# Patient Record
Sex: Female | Born: 1960 | Race: White | Hispanic: No | Marital: Single | Smoking: Former smoker
Health system: Southern US, Community
[De-identification: ages and names within clinical notes are randomized; demographics above are authoritative.]

## PROBLEM LIST (undated history)

## (undated) DIAGNOSIS — E039 Hypothyroidism, unspecified: Secondary | ICD-10-CM

## (undated) DIAGNOSIS — K589 Irritable bowel syndrome without diarrhea: Secondary | ICD-10-CM

## (undated) DIAGNOSIS — E785 Hyperlipidemia, unspecified: Secondary | ICD-10-CM

## (undated) DIAGNOSIS — I10 Essential (primary) hypertension: Secondary | ICD-10-CM

## (undated) HISTORY — PX: TUBAL LIGATION: SHX77

## (undated) HISTORY — PX: OVARIAN CYST REMOVAL: SHX89

## (undated) HISTORY — DX: Hyperlipidemia, unspecified: E78.5

## (undated) HISTORY — DX: Hypothyroidism, unspecified: E03.9

## (undated) HISTORY — DX: Irritable bowel syndrome, unspecified: K58.9

## (undated) HISTORY — PX: OTHER SURGICAL HISTORY: SHX169

## (undated) HISTORY — PX: CHOLECYSTECTOMY: SHX55

## (undated) HISTORY — DX: Essential (primary) hypertension: I10

---

## 2001-07-15 ENCOUNTER — Emergency Department (HOSPITAL_COMMUNITY): Admission: EM | Admit: 2001-07-15 | Discharge: 2001-07-15 | Payer: Self-pay | Admitting: Emergency Medicine

## 2001-12-11 ENCOUNTER — Encounter (INDEPENDENT_AMBULATORY_CARE_PROVIDER_SITE_OTHER): Payer: Self-pay | Admitting: Specialist

## 2001-12-11 ENCOUNTER — Ambulatory Visit (HOSPITAL_BASED_OUTPATIENT_CLINIC_OR_DEPARTMENT_OTHER): Admission: RE | Admit: 2001-12-11 | Discharge: 2001-12-11 | Payer: Self-pay | Admitting: Plastic Surgery

## 2003-12-19 ENCOUNTER — Other Ambulatory Visit: Admission: RE | Admit: 2003-12-19 | Discharge: 2003-12-19 | Payer: Self-pay | Admitting: Family Medicine

## 2004-04-24 ENCOUNTER — Ambulatory Visit: Payer: Self-pay | Admitting: Gastroenterology

## 2004-04-30 ENCOUNTER — Ambulatory Visit (HOSPITAL_COMMUNITY): Admission: RE | Admit: 2004-04-30 | Discharge: 2004-04-30 | Payer: Self-pay | Admitting: Gastroenterology

## 2005-04-15 ENCOUNTER — Other Ambulatory Visit: Admission: RE | Admit: 2005-04-15 | Discharge: 2005-04-15 | Payer: Self-pay | Admitting: Family Medicine

## 2006-03-02 IMAGING — NM NM HEPATO W/GB/PHARM/[PERSON_NAME]
1 series · 6 of 6 positions shown · non-contrast
Comparison: none

CLINICAL DATA: Abdominal pain.  
 HEPATOBILIARY SCAN WITH EJECTION FRACTION:  
 The study was performed after injection of 8.2 mCi of KKmNc Choletec.  There is prompt visualization of the liver, biliary ductal system and gallbladder.  When the gallbladder is optimally filled at 60 minutes, the patient was given 1.6 micrograms of cholecystokinin IV.  At 30 minutes, 54.7% has been ejected and at 60 minutes 55.3% has been ejected.

[hepatobiliary · 1.20mm/px · 6 of 10 frames shown]
[frame 1/10]
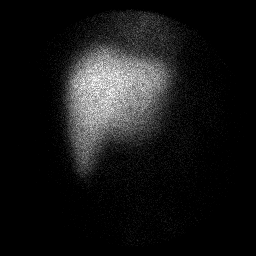
[frame 3/10]
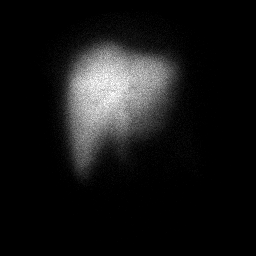
[frame 5/10]
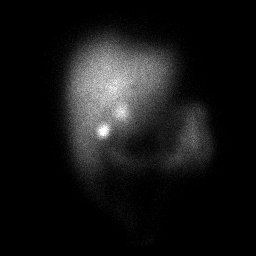
[frame 6/10]
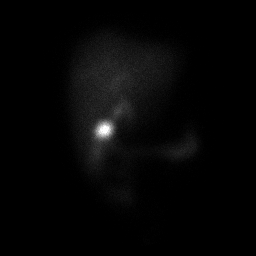
[frame 8/10]
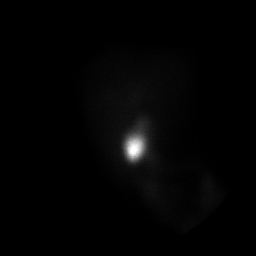
[frame 10/10]
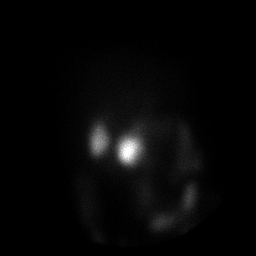

[6 of 6 positions shown; findings below may reference images not displayed]

IMPRESSION: 1.  There is prompt filling of the gallbladder. 
 2.  Ejection fraction is normal being 55% at 1 hour.  Normal is over 30%.

## 2008-03-06 ENCOUNTER — Ambulatory Visit: Payer: Self-pay | Admitting: Cardiology

## 2008-03-13 ENCOUNTER — Ambulatory Visit: Payer: Self-pay | Admitting: Cardiology

## 2008-07-31 ENCOUNTER — Ambulatory Visit: Payer: Self-pay | Admitting: Cardiology

## 2010-11-03 NOTE — Assessment & Plan Note (Signed)
[facility]                            CARDIOLOGY OFFICE NOTE   Christine Tran, Christine Tran                       MRN:          [id number]  DATE:03/06/2008                            DOB:          Mar 03, 1961    PRIMARY CARE PHYSICIAN:  Lindaann Pascal, PA   REASON FOR PRESENTATION:  Evaluate the patient with chest pain.   HISTORY OF PRESENT ILLNESS:  The patient is a pleasant 50 year old white  female without prior cardiac history.  She started riding bikes in March  for exercise.  These were long rides.  About 2 weeks ago, she was riding  up a hill.  It was one of the more exerting thing she had done.  She  developed some substernal chest tightness.  This radiated through to her  back.  It was severe.  She felt a metallic taste in her mouth.  She had  some lightheadedness and some nausea, but no vomiting.  She stated this  did not fell like a reflux.  She had to stop and it went away after 3-4  minutes.  She has been able be riding on level ground without getting  any of these symptoms.  She thinks her breathing has been fine.  She has  had no resting shortness of breath, PND, or orthopnea.  She had no  palpitations, presyncope, or syncope.  She has had a good exercise  tolerance.   PAST MEDICAL HISTORY:  Prehypertension, diet-controlled dyslipidemia,  hypothyroidism, and endometriosis.   PAST SURGICAL HISTORY:  Ovarian cyst, resected laparoscopically.   ALLERGIES INTOLERANCES:  MACRODANTIN.   MEDICATIONS:  1. BuSpar 10 mg b.i.d.  2. Birth control pills.  3. Lexapro 10 mg daily.  4. Lipitor 10 mg daily.  5. Lisinopril 10/12.5 daily.  6. Synthroid 75 mcg daily.   SOCIAL HISTORY:  The patient is not married.  She has no children.  She  works in Equities trader.  She quit smoking about 4 years ago after 1  pack per day for 20 years.  She drinks alcohol occasionally.   FAMILY HISTORY:  Noncontributory for early coronary artery disease,  though both of  her parents were alive in their 81s with hypertension.  She has a brother with diabetes.   REVIEW OF SYSTEMS:  As stated in the HPI.  Positive for occasional  migraines, irritable bowels, and reflux.  Negative for all other  systems.   PHYSICAL EXAMINATION:  GENERAL:  The patient is pleasant and in no  distress.  VITAL SIGNS:  Blood pressure 138/88, heart rate 90 and regular, weight  205 pounds, and body mass index 34.  HEENT:  Eyelids unremarkable; pupils equal, round, and reactive to  light; fundi not visualized; oral mucosa unremarkable.  NECK:  No jugular venous distension at 45 degrees; carotid upstroke  brisk and symmetric; no bruits, no thyromegaly.  LYMPHATICS:  No cervical, axillary or inguinal adenopathy.  LUNGS:  Clear to auscultation bilaterally.  BACK:  No costovertebral angle tenderness.  CHEST:  Unremarkable.  HEART:  PMI not displaced or sustained; S1 and S2 within normal limits;  no S3, no S4; no clicks, no rubs, no murmurs.  ABDOMEN:  Mildly overweight; positive bowel sounds, normal in frequency  and pitch; no bruits, no rebound, no guarding;  no midline pulsatile  mass; no hepatomegaly, no splenomegaly.  SKIN:  No rashes, no nodules.  EXTREMITIES:  2+ pulses throughout; no edema, no cyanosis, no clubbing.  NEURO:  Oriented to person, place and time; cranial nerves II through  XII grossly intact; motor grossly intact.   EKG sinus rhythm, rate 90, axis within normal limits, intervals within  normal limits, and no acute ST-T wave changes.   ASSESSMENT AND PLAN:  1. Chest discomfort.  The patient's chest discomfort has atypical      features.  However, it was exertional.  She does have      cardiovascular risk factors.  I think, the pretest probability of      obstructive coronary artery disease as the etiology is somewhat      low.  I think screening with maximum exercise stress test would be      beneficial prior to her doing a long bike ride, which she has       planned.  Therefore, I will bring her back for plane old exercise      treadmill (POET).  If she does well with this, then I would see no      need for further cardiovascular evaluation prior to this bike ride.  2. Hypertension.  Blood pressure is borderline.  We will judge how the      blood pressure does with exercise.  She has had this followed by      Lindaann Pascal.  3. Dyslipidemia.  Again, she is having this followed closely.  She has      been somewhat intolerant to STATINS.  She is having this managed      through diet.  4. Weight.  She has got a body mass index that puts her in the obese      range, but she is working on this, and I applaud that.  5. Followup will be at the time of her plane on exercise treadmill      (POET).     Rollene Rotunda, MD, [facility]  Electronically Signed    JH/MedQ  DD: 03/06/2008  DT: 03/07/2008  Job #: [id number]   cc:   Lindaann Pascal, PA

## 2010-11-03 NOTE — Procedures (Signed)
[facility]                              EXERCISE TREADMILL   Christine Tran, Christine Tran                       MRN:          [id number]  DATE:03/13/2008                            DOB:          15-Dec-1960    PRIMARY CARE PHYSICIAN:  Ernestina Penna, MD   PROCEDURE:  Exercise treadmill test.   INDICATIONS:  Evaluate the patient with chest discomfort.   PROCEDURE NOTE:  The patient is exercised using Standard Bruce protocol.  She was able to exercise for 9 minutes, which completed stage III.  She  achieved 10.4 METS.  She had a peak heart rate of 187, which was 106% of  predicted.  She had a blood pressure peak of 174/72, which was an  appropriate response.  She did not have any arrhythmias.  She did get to  her target heart rate very quickly in stage I.  She had a normal heart  rate recovery however.  The test was terminated because she achieved  target heart rate and because she was fatigued.  She did have some chest  discomfort and the metallic taste in her mouth and her peak exercise  level.  Again there were no ischemic ST wave changes.   CONCLUSION:  Negative adequate exercise treadmill test.   PLAN:  Based on the above, I told the patient she should not ride 47  miles in her next bike ride, which was her plan.  She has never got more  than 40.  I do not see any high risk features or evidence of obstructive  coronary disease.  However, I think her exercise tolerance needs to be  improved.  I suspect more deconditioning.  She is going to ride about 20  miles this week and which she has been doing routinely.  It is a level  course.  She is going to slowly build on this.  If she gets worse rather  than better, I would probably plan a cardiopulmonary stress test.  I am  going to see her back in a couple of months to see how she is doing  symptomatically.     Rollene Rotunda, MD, [facility]  Electronically Signed    JH/MedQ  DD: 03/13/2008  DT:  03/14/2008  Job #: [id number]   cc:   Lindaann Pascal, PA

## 2010-11-03 NOTE — Assessment & Plan Note (Signed)
[facility]                            CARDIOLOGY OFFICE NOTE   Christine Tran, Christine Tran                       MRN:          [id number]  DATE:07/31/2008                            DOB:          1961-04-20    PRIMARY CARE PHYSICIAN:  Lorin Picket Long, PA   REASON FOR PRESENTATION:  Evaluate the patient with shortness of breath  and chest pain.   HISTORY OF PRESENT ILLNESS:  The patient is 50 years old.  At the last  meeting, I did a stress test on her as she was complaining of the above.  She had an excellent response and a negative adequate result.  She had a  good exercise tolerance and no evidence of ischemia.  She was,  therefore, released to ride in a long bicycle trip.  However, I asked  her to limit herself to 20 miles at a time.  She did this with the most  part.  She actually went a little bit over this.  She did fine.  She had  no chest pain or shortness of breath.  She has had no complications  doing that ride and it was a quite exerting event.  She, however,  stopped exercising somewhat after that unfortunately.  She states now  she gets back on a bicycle and she is having the same symptoms she was  having when she started recently.  She is having dyspnea and more  breathlessness than she thinks she should.  She thinks that her  conditioning went down very quickly.  She is having some chest soreness  when she has to breathe hard.  This was similar to which she had prior  to her treadmill.  She is not having any resting symptoms.  She does not  have any palpitations, presyncope, or syncope.  She has not had any PND  or orthopnea.  She has had some weight gain.  She has had some swelling  in her forearms.   PAST MEDICAL HISTORY:  1. Prehypertension, diet controlled.  2. Dyslipidemia.  3. Hypothyroidism.  4. Endometriosis.  5. Ovarian cyst resected laparoscopically.   ALLERGIES/INTOLERANCES:  MACRODANTIN.   MEDICATIONS:  1. Synthroid 75 mcg  daily.  2. Pravastatin 40 mg daily.  3. Depo shot.  4. Celexa 20 mg daily.  5. Kapidex.   REVIEW OF SYSTEMS:  As stated in the HPI and otherwise negative for  other systems.   PHYSICAL EXAMINATION:  GENERAL:  The patient is in no distress.  VITAL SIGNS:  Blood pressure 136/88 and heart rate 94 and regular.  HEENT:  Eyelids unremarkable; pupils are equal, round, and reactive to  light; fundi not visualized; oral mucosa unremarkable.  NECK:  No jugular venous distention at 45 degrees, carotid upstroke  brisk and symmetric, no bruits, no thyromegaly.  LYMPHATICS:  No adenopathy.  LUNGS:  Clear to auscultation bilaterally.  BACK:  No costovertebral angle tenderness.  CHEST:  Unremarkable.  HEART:  PMI not displaced or sustained; S1 and S2 within normal limits,  no S3, no S4; no clicks, no rubs, no murmurs.  ABDOMEN:  Obese; positive bowel sounds, normal in frequency and pitch;  no bruits, no rebound, no guarding, no midline pulsatile mass; no  hepatomegaly, no splenomegaly.  SKIN:  No rashes, no nodules.  EXTREMITIES:  Pulses 2+, trace edema, no cyanosis, no clubbing.  NEURO:  Oriented to person, place, and time; cranial nerves II through  XII grossly intact; motor grossly intact.   EKG, sinus rhythm, rate 95, axis within normal limits, intervals within  normal limits, no acute ST-T wave changes.   ASSESSMENT AND PLAN:  1. Dyspnea and chest discomfort.  This is similar to her what she had      prior to her treadmill.  I think it is probably related to some      deconditioning as she had stopped exercising and then restarted.      We talked about this at length.  She should slowly get back to her      exercise regimen.  If with weight loss and training she got worse      rather than better with dyspnea or chest discomfort, I need to see      her to reevaluate.  However, at this point, I think the possibility      of structural heart disease as an etiology is quite low.  2.  Hypertension.  Her blood pressure has been at borderline.  She has      some mild edema.  She had some mild hypertensive response with      exercise.  I am going to give her 12.5 mg of hydrochlorothiazide.      She is going to get a BMET in about 2 weeks.  She will keep an eye      on her blood pressure.  If she loses weight, we probably be able to      come off of this.  3. Obesity.  We have discussed this at length.  4. Followup.  I will see the patient back as needed.     Rollene Rotunda, MD, [facility]  Electronically Signed    JH/MedQ  DD: 07/31/2008  DT: 07/31/2008  Job #: [id number]   cc:   Lindaann Pascal, PA

## 2010-11-06 NOTE — Op Note (Signed)
[place]  Patient:    Christine Tran, Christine Tran Visit Number: [id number] MRN: [id number]          Service Type: DSU Location: [facility] [place] [facility] Attending Physician:  Loura Halt Ii Dictated by:   Alfredia Ferguson, M.D. Proc. Date: 12/11/01 Admit Date:  12/11/2001                             Operative Report  PREOPERATIVE DIAGNOSIS:  A 1 cm nonhealing wound, left midforehead, malignant appearing lesion.  POSTOPERATIVE DIAGNOSIS:  A 1 cm nonhealing wound, left midforehead, malignant appearing lesion.  OPERATION PERFORMED:  Excision of a 1 cm x 5 mm malignant appearing lesion, left midforehead.  SURGEON:  Alfredia Ferguson, M.D.  ANESTHESIA:  2% Xylocaine 1:100,000 epinephrine.  INDICATIONS FOR PROCEDURE:  She is a 50 year old woman with a nonhealing wound for the last nine months in the left midforehead.  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  She wishes to have this area excised.  In spite of the fact there is no biopsy it appears to be a fairly sinister lesion.  My plan is to excise it as if it were a malignant lesion.  The patient understands she will be trading what she has for a permanent and potentially unsightly scar.  In spite of that she wished to proceed with the surgery.  DESCRIPTION OF PROCEDURE:  Skin markers were placed in elliptical fashion, transversely orienting the marks with approximately 2 to 3 mm margins.  Local anesthesia was then infiltrated using 2% Xylocaine 1:100,000 epinephrine.  The forehead was prepped and draped in a sterile fashion.  After waiting approximately 10 minutes, an elliptical excision of the lesion down to the level of the frontalis muscle was carried out.  Specimen was passed off for pathology.  Hemostasis was accomplished using electrocautery.  Wound edges were undermined for a distance of several millimeters in all directions.  The wound was closed by approximating  the dermis using interrupted 5-0 Monocryl suture.  Skin edges were united using a running 5-0 Monocryl subcuticular. Steri-Strips were applied.  The area was cleansed, dried and a light dressing was applied.  The patient was discharged home in satisfactory condition. Dictated by:   Alfredia Ferguson, M.D. Attending Physician:  Loura Halt Ii DD:  12/11/01 TD:  12/12/01 Job: [id number]

## 2011-01-28 ENCOUNTER — Encounter: Payer: Self-pay | Admitting: *Deleted

## 2012-01-19 ENCOUNTER — Other Ambulatory Visit: Payer: Self-pay | Admitting: Family Medicine

## 2012-01-26 ENCOUNTER — Other Ambulatory Visit: Payer: Self-pay | Admitting: Family Medicine

## 2013-02-09 ENCOUNTER — Encounter: Payer: Self-pay | Admitting: Gastroenterology

## 2013-02-28 ENCOUNTER — Encounter: Payer: Self-pay | Admitting: Gastroenterology

## 2013-04-16 ENCOUNTER — Ambulatory Visit: Payer: Self-pay | Admitting: Gastroenterology

## 2013-06-22 ENCOUNTER — Encounter: Payer: Self-pay | Admitting: *Deleted

## 2013-07-11 ENCOUNTER — Ambulatory Visit (INDEPENDENT_AMBULATORY_CARE_PROVIDER_SITE_OTHER): Payer: Managed Care, Other (non HMO) | Admitting: Cardiology

## 2013-07-11 ENCOUNTER — Encounter: Payer: Self-pay | Admitting: Cardiology

## 2013-07-11 VITALS — BP 146/83 | HR 77 | Ht 64.5 in | Wt 196.0 lb

## 2013-07-11 DIAGNOSIS — R011 Cardiac murmur, unspecified: Secondary | ICD-10-CM

## 2013-07-11 DIAGNOSIS — I471 Supraventricular tachycardia: Secondary | ICD-10-CM

## 2013-07-11 DIAGNOSIS — R9431 Abnormal electrocardiogram [ECG] [EKG]: Secondary | ICD-10-CM

## 2013-07-11 NOTE — Progress Notes (Signed)
HPI The patient was last seen by me in 2010. Had a negative exercise treadmill test in 2009.  She has been an avid bike rider but she has limited her rides to at most 60 miles in the past.  She however wants to go on a three-day much longer ride this summer. She wants to train for this and she wanted to make sure that she was okay for this. Of note her mother was found to have 99% LAD stenosis not long ago. The patient however has done quite well since I last saw her. She does have a rapid heart rate but he typically comes down quickly if she watches her breathing. She used to get very tachycardic with her bike rides that she has learned how to take a break if her heart rate goes up and she's not easily reaching the one he had before.  The patient denies any new symptoms such as chest discomfort, neck or arm discomfort. There has been no new shortness of breath, PND or orthopnea. There have been no reported palpitations, presyncope or syncope.  Allergies  Allergen Reactions  . Macrodantin [Nitrofurantoin Macrocrystal]     rash    Current Outpatient Prescriptions  Medication Sig Dispense Refill  . ALPRAZolam (XANAX XR) 0.5 MG 24 hr tablet Take 0.5 mg by mouth as needed for anxiety.      Marland Kitchen. amitriptyline (ELAVIL) 25 MG tablet Take 25 mg by mouth at bedtime.      . furosemide (LASIX) 40 MG tablet Take 40 mg by mouth daily.      . NP THYROID PO Take 1.5 g by mouth 2 (two) times daily.      . progesterone (PROMETRIUM) 100 MG capsule Take 100 mg by mouth daily.       No current facility-administered medications for this visit.    Past Medical History  Diagnosis Date  . Hypertension   . SOB (shortness of breath)   . Dyslipidemia   . Hypothyroidism   . Obesity   . Endometriosis     Past Surgical History  Procedure Laterality Date  . Ovarian cyst removal      Family History  Problem Relation Age of Onset  . Hypertension Mother     alive  . Hypertension Father     alive  . Diabetes  Brother     History   Social History  . Marital Status: Single    Spouse Name: N/A    Number of Children: N/A  . Years of Education: N/A   Occupational History  . Not on file.   Social History Main Topics  . Smoking status: Former Games developermoker  . Smokeless tobacco: Not on file  . Alcohol Use: Yes     Comment: occasional  . Drug Use: No  . Sexual Activity: Not on file   Other Topics Concern  . Not on file   Social History Narrative  . No narrative on file    ROS:  As stated in the HPI and negative for all other systems.  PHYSICAL EXAM BP 146/83  Pulse 77  Ht 5' 4.5" (1.638 m)  Wt 196 lb (88.905 kg)  BMI 33.14 kg/m2 GENERAL:  Well appearing HEENT:  Pupils equal round and reactive, fundi not visualized, oral mucosa unremarkable NECK:  No jugular venous distention, waveform within normal limits, carotid upstroke brisk and symmetric, no bruits, no thyromegaly LYMPHATICS:  No cervical, inguinal adenopathy LUNGS:  Clear to auscultation bilaterally BACK:  No CVA tenderness  CHEST:  Unremarkable HEART:  PMI not displaced or sustained,S1 and S2 within normal limits, no S3, no S4, no clicks, no rubs, 2/6 brief right upper sternal border systolic murmur, no diastolic murmurs ABD:  Flat, positive bowel sounds normal in frequency in pitch, no bruits, no rebound, no guarding, no midline pulsatile mass, no hepatomegaly, no splenomegaly EXT:  2 plus pulses throughout, no edema, no cyanosis no clubbing SKIN:  No rashes no nodules NEURO:  Cranial nerves II through XII grossly intact, motor grossly intact throughout PSYCH:  Cognitively intact, oriented to person place and time   EKG:  Sinus rhythm, rate 77, axis within normal limits, intervals within normal limits, no acute ST-T wave changes.  07/11/2013   ASSESSMENT AND PLAN   MURMUR:  I will check an echocardiogram to rule out valvular heart disease since she wants to participate in strenuous exercise.  TACHYCARDIA:  This will be  evaluated as above.  However, she has otherwise had an extensive workup of this in the past and this is her baseline. I do not suspect any other further workup will be required unless she has symptoms when she is training for her upcoming ride.  CHEST PAIN:  She no longer has this.  No change in therapy is indicated.

## 2013-07-11 NOTE — Patient Instructions (Signed)
The current medical regimen is effective;  continue present plan and medications.  Your physician has requested that you have an echocardiogram. Echocardiography is a painless test that uses sound waves to create images of your heart. It provides your doctor with information about the size and shape of your heart and how well your heart's chambers and valves are working. This procedure takes approximately one hour. There are no restrictions for this procedure.  Follow up as needed. 

## 2013-07-12 DIAGNOSIS — R9431 Abnormal electrocardiogram [ECG] [EKG]: Secondary | ICD-10-CM | POA: Insufficient documentation

## 2013-07-12 DIAGNOSIS — R000 Tachycardia, unspecified: Secondary | ICD-10-CM | POA: Insufficient documentation

## 2013-07-30 ENCOUNTER — Ambulatory Visit (HOSPITAL_COMMUNITY): Payer: Managed Care, Other (non HMO) | Attending: Cardiology | Admitting: Radiology

## 2013-07-30 DIAGNOSIS — R0602 Shortness of breath: Secondary | ICD-10-CM | POA: Insufficient documentation

## 2013-07-30 DIAGNOSIS — E785 Hyperlipidemia, unspecified: Secondary | ICD-10-CM | POA: Insufficient documentation

## 2013-07-30 DIAGNOSIS — I059 Rheumatic mitral valve disease, unspecified: Secondary | ICD-10-CM | POA: Insufficient documentation

## 2013-07-30 DIAGNOSIS — R011 Cardiac murmur, unspecified: Secondary | ICD-10-CM

## 2013-07-30 DIAGNOSIS — I079 Rheumatic tricuspid valve disease, unspecified: Secondary | ICD-10-CM | POA: Insufficient documentation

## 2013-07-30 DIAGNOSIS — R000 Tachycardia, unspecified: Secondary | ICD-10-CM | POA: Insufficient documentation

## 2013-07-30 DIAGNOSIS — I10 Essential (primary) hypertension: Secondary | ICD-10-CM | POA: Insufficient documentation

## 2013-07-30 DIAGNOSIS — E039 Hypothyroidism, unspecified: Secondary | ICD-10-CM | POA: Insufficient documentation

## 2013-07-30 NOTE — Progress Notes (Signed)
Echocardiogram performed.  

## 2013-08-01 ENCOUNTER — Telehealth: Payer: Self-pay | Admitting: Cardiology

## 2013-08-01 NOTE — Telephone Encounter (Signed)
Pt aware of echo results . A copy of echo result  was mailed to pt per pt's request.

## 2013-08-01 NOTE — Telephone Encounter (Signed)
Went over echo result again. Pt verbalized understanding.

## 2013-08-01 NOTE — Telephone Encounter (Signed)
Follow up        Pt needs echo results again

## 2013-08-01 NOTE — Telephone Encounter (Signed)
New message     Returned the nurses call from yesterday

## 2014-02-26 ENCOUNTER — Encounter: Payer: Self-pay | Admitting: Gastroenterology

## 2014-09-23 ENCOUNTER — Encounter: Payer: Self-pay | Admitting: Gastroenterology

## 2015-07-07 ENCOUNTER — Encounter: Payer: Self-pay | Admitting: Cardiology

## 2015-07-16 ENCOUNTER — Encounter: Payer: Self-pay | Admitting: Cardiology

## 2015-07-16 ENCOUNTER — Ambulatory Visit (INDEPENDENT_AMBULATORY_CARE_PROVIDER_SITE_OTHER): Payer: Managed Care, Other (non HMO) | Admitting: Cardiology

## 2015-07-16 VITALS — BP 144/94 | HR 97 | Ht 65.0 in | Wt 197.0 lb

## 2015-07-16 DIAGNOSIS — R079 Chest pain, unspecified: Secondary | ICD-10-CM

## 2015-07-16 DIAGNOSIS — I340 Nonrheumatic mitral (valve) insufficiency: Secondary | ICD-10-CM

## 2015-07-16 NOTE — Patient Instructions (Signed)
Medication Instructions:  The current medical regimen is effective;  continue present plan and medications.  Testing/Procedures: Your physician has requested that you have an exercise tolerance test. For further information please visit [email or web address] Please also follow instruction sheet, as given.  Your physician has requested that you have an echocardiogram. Echocardiography is a painless test that uses sound waves to create images of your heart. It provides your doctor with information about the size and shape of your heart and how well your heart's chambers and valves are working. This procedure takes approximately one hour. There are no restrictions for this procedure.  Follow-Up: Follow up as instructed/needed after testing.  If you need a refill on your cardiac medications before your next appointment, please call your pharmacy.  Thank you for choosing [facility]!!

## 2015-07-16 NOTE — Progress Notes (Signed)
   HPI The patient was last seen by me in 2010. Had a negative exercise treadmill test in 2009.  I saw her a couple of years ago.  She had mild MR on echo and was asked to return in two years for clinic follow up.  Since that time she has done relatively well. Recently she's had some sharp chest discomfort. She had this in mid December. It happened while going to work. She's had some general fatigue. She's noticed some dyspnea walking up stairs although this isn't always reproducible. She's not had any neck or arm discomfort. She's not had any new palpitations, presyncope or syncope. She's not describing PND or orthopnea. She got married since I last saw her.  Also her father committed suicide recently.    Allergies  Allergen Reactions  . Macrodantin [Nitrofurantoin Macrocrystal]     rash    Current Outpatient Prescriptions  Medication Sig Dispense Refill  . ALPRAZolam (XANAX XR) 0.5 MG 24 hr tablet Take 0.5 mg by mouth as needed for anxiety.    . ciprofloxacin (CIPRO) 500 MG tablet Take 500 mg by mouth as needed (take one tablet by mouth as needed).    . furosemide (LASIX) 40 MG tablet Take 40 mg by mouth as needed for fluid (take one daily by mouth as needed).     . NP THYROID PO Take 1.5 g by mouth 2 (two) times daily.     No current facility-administered medications for this visit.    Past Medical History  Diagnosis Date  . Hypertension   . Dyslipidemia   . Hypothyroidism   . Endometriosis   . IBS (irritable bowel syndrome)     Past Surgical History  Procedure Laterality Date  . Ovarian cyst removal    . Cholecystectomy    . Tubal ligation    . Endometrial abl      ROS:  As stated in the HPI and negative for all other systems.  PHYSICAL EXAM BP 144/94 mmHg  Pulse 97  Ht  (1.651 m)  Wt 197 lb (89.359 kg)  BMI 32.78 kg/m2 GENERAL:  Well appearing HEENT:  Pupils equal round and reactive, fundi not visualized, oral mucosa unremarkable NECK:  No jugular venous  distention, waveform within normal limits, carotid upstroke brisk and symmetric, no bruits, no thyromegaly LUNGS:  Clear to auscultation bilaterally BACK:  No CVA tenderness CHEST:  Unremarkable HEART:  PMI not displaced or sustained,S1 and S2 within normal limits, no S3, no S4, no clicks, no rubs, 2/6 brief right upper sternal border systolic murmur, no diastolic murmurs ABD:  Flat, positive bowel sounds normal in frequency in pitch, no bruits, no rebound, no guarding, no midline pulsatile mass, no hepatomegaly, no splenomegaly EXT:  2 plus pulses throughout, no edema, no cyanosis no clubbing    EKG:  Sinus rhythm, rate 97, axis within normal limits, intervals within normal limits, no acute ST-T wave changes.  07/16/2015   ASSESSMENT AND PLAN   MR:  The patient had mild MR 2 years ago. I will repeat an echocardiogram.  CHEST PAIN:  She does have some cardiovascular risk factors. I think the pretest probability is somewhat low but stress testing is indicated.  She will have a POET (Plain Old Exercise Treadmill)

## 2015-08-01 ENCOUNTER — Ambulatory Visit (HOSPITAL_COMMUNITY): Payer: Managed Care, Other (non HMO) | Attending: Cardiology

## 2015-08-01 ENCOUNTER — Other Ambulatory Visit: Payer: Self-pay

## 2015-08-01 ENCOUNTER — Ambulatory Visit (INDEPENDENT_AMBULATORY_CARE_PROVIDER_SITE_OTHER): Payer: Managed Care, Other (non HMO)

## 2015-08-01 ENCOUNTER — Encounter: Payer: Managed Care, Other (non HMO) | Admitting: Physician Assistant

## 2015-08-01 DIAGNOSIS — I340 Nonrheumatic mitral (valve) insufficiency: Secondary | ICD-10-CM

## 2015-08-01 DIAGNOSIS — E785 Hyperlipidemia, unspecified: Secondary | ICD-10-CM | POA: Insufficient documentation

## 2015-08-01 DIAGNOSIS — R079 Chest pain, unspecified: Secondary | ICD-10-CM | POA: Insufficient documentation

## 2015-08-01 DIAGNOSIS — Z8249 Family history of ischemic heart disease and other diseases of the circulatory system: Secondary | ICD-10-CM | POA: Diagnosis not present

## 2015-08-01 DIAGNOSIS — Z87891 Personal history of nicotine dependence: Secondary | ICD-10-CM | POA: Diagnosis not present

## 2015-08-01 DIAGNOSIS — I10 Essential (primary) hypertension: Secondary | ICD-10-CM | POA: Insufficient documentation

## 2015-08-01 LAB — EXERCISE TOLERANCE TEST
Estimated workload: 6.9 METS
Exercise duration (min): 5 min
MPHR: 168 {beats}/min
Peak HR: 171 {beats}/min
Percent HR: 102 %
RPE: 14
Rest HR: 100 {beats}/min

## 2019-02-19 ENCOUNTER — Ambulatory Visit (INDEPENDENT_AMBULATORY_CARE_PROVIDER_SITE_OTHER): Payer: BC Managed Care – PPO | Admitting: Orthopaedic Surgery

## 2019-02-19 ENCOUNTER — Ambulatory Visit: Payer: Self-pay

## 2019-02-19 DIAGNOSIS — M25462 Effusion, left knee: Secondary | ICD-10-CM | POA: Diagnosis not present

## 2019-02-19 DIAGNOSIS — M25562 Pain in left knee: Secondary | ICD-10-CM | POA: Diagnosis not present

## 2019-02-19 MED ORDER — LIDOCAINE HCL 1 % IJ SOLN
3.0000 mL | INTRAMUSCULAR | Status: AC | PRN
  Administered 2019-02-19: 10:00:00 3 mL

## 2019-02-19 MED ORDER — METHYLPREDNISOLONE ACETATE 40 MG/ML IJ SUSP
40.0000 mg | INTRAMUSCULAR | Status: AC | PRN
  Administered 2019-02-19: 40 mg via INTRA_ARTICULAR

## 2019-02-19 NOTE — Progress Notes (Signed)
Office Visit Note   Patient: Christine Tran           Date of Birth: March 12, 1961           MRN: [id number] Visit Date: 02/19/2019              Requested by: Scherrie Novemberimmons, Ashley, NP No address on file PCP: Scherrie Novemberimmons, Ashley, NP   Assessment & Plan: Visit Diagnoses:  1. Left knee pain, unspecified chronicity   2. Effusion, left knee     Plan: Even though I am seeing her as a new patient I have actually seen her for many years with her husband who is a patient of mine.  I did offer steroid injection her left knee today.  I was able to aspirate about 20 cc of fluid off of the knee which was clear.  I placed a steroid injection in her left knee without difficulty.  She understands risk benefits of these injections as well.  All question concerns were answered and addressed.  Follow-up will be as needed.  However if she continues develop a recurrent effusion I want her to call because we would need to obtain an MRI of her left knee to rule out a meniscal tear.  Follow-Up Instructions: Return if symptoms worsen or fail to improve.   Orders:  Orders Placed This Encounter  Procedures  . Large Joint Inj  . XR Knee 1-2 Views Left   No orders of the defined types were placed in this encounter.     Procedures: Large Joint Inj: L knee on 02/19/2019 10:13 AM Indications: diagnostic evaluation and pain Details: 22 G 1.5 in needle, superolateral approach  Arthrogram: No  Medications: 3 mL lidocaine 1 %; 40 mg methylPREDNISolone acetate 40 MG/ML Outcome: tolerated well, no immediate complications Procedure, treatment alternatives, risks and benefits explained, specific risks discussed. Consent was given by the patient. Immediately prior to procedure a time out was called to verify the correct patient, procedure, equipment, support staff and site/side marked as required. Patient was prepped and draped in the usual sterile fashion.       Clinical Data: No additional findings.   Subjective:  Chief Complaint  Patient presents with  . Left Knee - Pain  The patient comes in today with acute left knee pain and swelling.  She had been to the beach recently and developed a lot of swelling after that.  Is been slowly coming down.  She denies any locking catching going up and down stairs is really bothering her left knee.  She has not injured this knee before.  HPI  Review of Systems She currently denies any headache, chest pain, shortness of breath, fever, chills, nausea, vomiting  Objective: Vital Signs: There were no vitals taken for this visit.  Physical Exam She is alert and orient x3 and in no acute distress Ortho Exam Examination of her left knee shows just a slight effusion comparing the left and right knees.  Feels ligaments are stable.  There is some patellofemoral crepitation.  There is medial and lateral joint line tenderness but otherwise a normal exam. Specialty Comments:  No specialty comments available.  Imaging: Xr Knee 1-2 Views Left  Result Date: 02/19/2019 2 views of the left knee show no acute findings.  There is a bipartite patella but otherwise some slight patellofemoral thinning changes.  The medial lateral compartments are well maintained.    PMFS History: Patient Active Problem List   Diagnosis Date Noted  . Abnormal EKG  07/12/2013  . Tachycardia 07/12/2013   Past Medical History:  Diagnosis Date  . Dyslipidemia   . Endometriosis   . Hypertension   . Hypothyroidism   . IBS (irritable bowel syndrome)     Family History  Problem Relation Age of Onset  . CAD Mother 60  . Hypertension Father        alive  . Diabetes Brother     Past Surgical History:  Procedure Laterality Date  . CHOLECYSTECTOMY    . endometrial abl    . OVARIAN CYST REMOVAL    . TUBAL LIGATION     Social History   Occupational History  . Not on file  Tobacco Use  . Smoking status: Former Research scientist (life sciences)  . Tobacco comment: Quit tobacco 10 years ago.   Substance and  Sexual Activity  . Alcohol use: Yes    Comment: occasional  . Drug use: No  . Sexual activity: Not on file

## 2019-09-11 ENCOUNTER — Ambulatory Visit: Payer: BC Managed Care – PPO | Admitting: Orthopaedic Surgery

## 2019-10-15 ENCOUNTER — Ambulatory Visit: Payer: BC Managed Care – PPO | Admitting: Orthopaedic Surgery

## 2019-10-29 ENCOUNTER — Ambulatory Visit: Payer: BC Managed Care – PPO | Admitting: Orthopaedic Surgery
